# Patient Record
Sex: Female | Born: 1980 | Race: White | Hispanic: No | Marital: Married | Smoking: Former smoker
Health system: Southern US, Community
[De-identification: ages and names within clinical notes are randomized; demographics above are authoritative.]

---

## 2010-11-03 ENCOUNTER — Other Ambulatory Visit (HOSPITAL_COMMUNITY): Payer: Self-pay | Admitting: Obstetrics and Gynecology

## 2010-11-03 DIAGNOSIS — Z0489 Encounter for examination and observation for other specified reasons: Secondary | ICD-10-CM

## 2010-11-03 DIAGNOSIS — IMO0001 Reserved for inherently not codable concepts without codable children: Secondary | ICD-10-CM

## 2010-11-15 ENCOUNTER — Other Ambulatory Visit (HOSPITAL_COMMUNITY): Payer: Self-pay

## 2010-11-23 ENCOUNTER — Other Ambulatory Visit (HOSPITAL_COMMUNITY): Payer: Self-pay | Admitting: Obstetrics and Gynecology

## 2010-11-23 ENCOUNTER — Ambulatory Visit (HOSPITAL_COMMUNITY)
Admission: RE | Admit: 2010-11-23 | Discharge: 2010-11-23 | Disposition: A | Discharge status: Home / Self Care | Payer: Medicaid Other | Source: Ambulatory Visit | Attending: Obstetrics and Gynecology | Admitting: Obstetrics and Gynecology

## 2010-11-23 DIAGNOSIS — Z1389 Encounter for screening for other disorder: Secondary | ICD-10-CM | POA: Insufficient documentation

## 2010-11-23 DIAGNOSIS — Z0489 Encounter for examination and observation for other specified reasons: Secondary | ICD-10-CM

## 2010-11-23 DIAGNOSIS — IMO0001 Reserved for inherently not codable concepts without codable children: Secondary | ICD-10-CM

## 2010-11-23 DIAGNOSIS — O358XX0 Maternal care for other (suspected) fetal abnormality and damage, not applicable or unspecified: Secondary | ICD-10-CM | POA: Insufficient documentation

## 2010-11-23 DIAGNOSIS — O30009 Twin pregnancy, unspecified number of placenta and unspecified number of amniotic sacs, unspecified trimester: Secondary | ICD-10-CM | POA: Insufficient documentation

## 2010-11-23 DIAGNOSIS — Z363 Encounter for antenatal screening for malformations: Secondary | ICD-10-CM | POA: Insufficient documentation

## 2010-12-21 ENCOUNTER — Ambulatory Visit (HOSPITAL_COMMUNITY)
Admission: RE | Admit: 2010-12-21 | Discharge: 2010-12-21 | Disposition: A | Discharge status: Home / Self Care | Payer: Medicaid Other | Source: Ambulatory Visit | Attending: Obstetrics and Gynecology | Admitting: Obstetrics and Gynecology

## 2010-12-21 ENCOUNTER — Other Ambulatory Visit (HOSPITAL_COMMUNITY): Payer: Self-pay | Admitting: Obstetrics and Gynecology

## 2010-12-21 ENCOUNTER — Ambulatory Visit (HOSPITAL_COMMUNITY): Payer: Medicaid Other

## 2010-12-21 DIAGNOSIS — IMO0001 Reserved for inherently not codable concepts without codable children: Secondary | ICD-10-CM

## 2010-12-21 DIAGNOSIS — O09299 Supervision of pregnancy with other poor reproductive or obstetric history, unspecified trimester: Secondary | ICD-10-CM | POA: Insufficient documentation

## 2010-12-21 DIAGNOSIS — O34219 Maternal care for unspecified type scar from previous cesarean delivery: Secondary | ICD-10-CM | POA: Insufficient documentation

## 2010-12-21 DIAGNOSIS — O30009 Twin pregnancy, unspecified number of placenta and unspecified number of amniotic sacs, unspecified trimester: Secondary | ICD-10-CM | POA: Insufficient documentation

## 2011-01-19 ENCOUNTER — Ambulatory Visit (HOSPITAL_COMMUNITY): Payer: Medicaid Other

## 2011-01-19 ENCOUNTER — Other Ambulatory Visit (HOSPITAL_COMMUNITY): Payer: Self-pay | Admitting: Obstetrics and Gynecology

## 2011-01-19 ENCOUNTER — Ambulatory Visit (HOSPITAL_COMMUNITY)
Admission: RE | Admit: 2011-01-19 | Discharge: 2011-01-19 | Disposition: A | Discharge status: Home / Self Care | Payer: Medicaid Other | Source: Ambulatory Visit | Attending: Obstetrics and Gynecology | Admitting: Obstetrics and Gynecology

## 2011-01-19 DIAGNOSIS — O30009 Twin pregnancy, unspecified number of placenta and unspecified number of amniotic sacs, unspecified trimester: Secondary | ICD-10-CM | POA: Insufficient documentation

## 2011-01-19 DIAGNOSIS — IMO0001 Reserved for inherently not codable concepts without codable children: Secondary | ICD-10-CM

## 2011-01-19 DIAGNOSIS — Z3689 Encounter for other specified antenatal screening: Secondary | ICD-10-CM | POA: Insufficient documentation

## 2011-02-09 ENCOUNTER — Encounter (HOSPITAL_COMMUNITY): Payer: Self-pay

## 2011-02-09 ENCOUNTER — Other Ambulatory Visit (HOSPITAL_COMMUNITY): Payer: Self-pay | Admitting: Obstetrics and Gynecology

## 2011-02-09 ENCOUNTER — Ambulatory Visit (HOSPITAL_COMMUNITY): Payer: Medicaid Other

## 2011-02-09 ENCOUNTER — Ambulatory Visit (HOSPITAL_COMMUNITY)
Admission: RE | Admit: 2011-02-09 | Discharge: 2011-02-09 | Disposition: A | Discharge status: Home / Self Care | Payer: Medicaid Other | Source: Ambulatory Visit | Attending: Obstetrics and Gynecology | Admitting: Obstetrics and Gynecology

## 2011-02-09 DIAGNOSIS — O30009 Twin pregnancy, unspecified number of placenta and unspecified number of amniotic sacs, unspecified trimester: Secondary | ICD-10-CM | POA: Insufficient documentation

## 2011-02-09 DIAGNOSIS — IMO0001 Reserved for inherently not codable concepts without codable children: Secondary | ICD-10-CM

## 2011-02-09 DIAGNOSIS — O34219 Maternal care for unspecified type scar from previous cesarean delivery: Secondary | ICD-10-CM | POA: Insufficient documentation

## 2011-02-09 DIAGNOSIS — Z3689 Encounter for other specified antenatal screening: Secondary | ICD-10-CM | POA: Insufficient documentation

## 2011-02-15 ENCOUNTER — Other Ambulatory Visit (HOSPITAL_COMMUNITY): Payer: Self-pay | Admitting: Obstetrics and Gynecology

## 2011-02-15 ENCOUNTER — Ambulatory Visit (HOSPITAL_COMMUNITY)
Admission: RE | Admit: 2011-02-15 | Discharge: 2011-02-15 | Disposition: A | Discharge status: Home / Self Care | Payer: Medicaid Other | Source: Ambulatory Visit | Attending: Obstetrics and Gynecology | Admitting: Obstetrics and Gynecology

## 2011-02-15 DIAGNOSIS — IMO0001 Reserved for inherently not codable concepts without codable children: Secondary | ICD-10-CM

## 2011-02-15 DIAGNOSIS — Z3689 Encounter for other specified antenatal screening: Secondary | ICD-10-CM | POA: Insufficient documentation

## 2011-02-15 DIAGNOSIS — O30009 Twin pregnancy, unspecified number of placenta and unspecified number of amniotic sacs, unspecified trimester: Secondary | ICD-10-CM | POA: Insufficient documentation

## 2011-02-15 DIAGNOSIS — O34219 Maternal care for unspecified type scar from previous cesarean delivery: Secondary | ICD-10-CM | POA: Insufficient documentation

## 2011-02-21 ENCOUNTER — Other Ambulatory Visit (HOSPITAL_COMMUNITY): Payer: Self-pay | Admitting: Obstetrics and Gynecology

## 2011-02-21 DIAGNOSIS — IMO0001 Reserved for inherently not codable concepts without codable children: Secondary | ICD-10-CM

## 2011-02-22 ENCOUNTER — Ambulatory Visit (HOSPITAL_COMMUNITY)
Admission: RE | Admit: 2011-02-22 | Discharge: 2011-02-22 | Disposition: A | Discharge status: Home / Self Care | Payer: Medicaid Other | Source: Ambulatory Visit | Attending: Obstetrics and Gynecology | Admitting: Obstetrics and Gynecology

## 2011-02-22 ENCOUNTER — Other Ambulatory Visit (HOSPITAL_COMMUNITY): Payer: Self-pay | Admitting: Obstetrics and Gynecology

## 2011-02-22 DIAGNOSIS — O30009 Twin pregnancy, unspecified number of placenta and unspecified number of amniotic sacs, unspecified trimester: Secondary | ICD-10-CM | POA: Insufficient documentation

## 2011-02-22 DIAGNOSIS — IMO0001 Reserved for inherently not codable concepts without codable children: Secondary | ICD-10-CM

## 2011-02-22 DIAGNOSIS — Z3689 Encounter for other specified antenatal screening: Secondary | ICD-10-CM | POA: Insufficient documentation

## 2011-02-28 ENCOUNTER — Ambulatory Visit (HOSPITAL_COMMUNITY)
Admission: RE | Admit: 2011-02-28 | Discharge: 2011-02-28 | Disposition: A | Discharge status: Home / Self Care | Payer: Medicaid Other | Source: Ambulatory Visit | Attending: Obstetrics and Gynecology | Admitting: Obstetrics and Gynecology

## 2011-02-28 ENCOUNTER — Other Ambulatory Visit (HOSPITAL_COMMUNITY): Payer: Self-pay | Admitting: Obstetrics and Gynecology

## 2011-02-28 DIAGNOSIS — IMO0001 Reserved for inherently not codable concepts without codable children: Secondary | ICD-10-CM

## 2011-02-28 DIAGNOSIS — Z3689 Encounter for other specified antenatal screening: Secondary | ICD-10-CM | POA: Insufficient documentation

## 2011-02-28 DIAGNOSIS — O30009 Twin pregnancy, unspecified number of placenta and unspecified number of amniotic sacs, unspecified trimester: Secondary | ICD-10-CM | POA: Insufficient documentation

## 2011-03-07 ENCOUNTER — Ambulatory Visit (HOSPITAL_COMMUNITY)
Admission: RE | Admit: 2011-03-07 | Discharge: 2011-03-07 | Disposition: A | Discharge status: Home / Self Care | Payer: Medicaid Other | Source: Ambulatory Visit | Attending: Obstetrics and Gynecology | Admitting: Obstetrics and Gynecology

## 2011-03-07 DIAGNOSIS — O34219 Maternal care for unspecified type scar from previous cesarean delivery: Secondary | ICD-10-CM | POA: Insufficient documentation

## 2011-03-07 DIAGNOSIS — Z3689 Encounter for other specified antenatal screening: Secondary | ICD-10-CM | POA: Insufficient documentation

## 2011-03-07 DIAGNOSIS — O30009 Twin pregnancy, unspecified number of placenta and unspecified number of amniotic sacs, unspecified trimester: Secondary | ICD-10-CM | POA: Insufficient documentation

## 2011-03-07 DIAGNOSIS — IMO0001 Reserved for inherently not codable concepts without codable children: Secondary | ICD-10-CM

## 2011-03-14 ENCOUNTER — Encounter (HOSPITAL_COMMUNITY): Payer: Self-pay

## 2011-03-14 ENCOUNTER — Ambulatory Visit (HOSPITAL_COMMUNITY)
Admission: RE | Admit: 2011-03-14 | Discharge: 2011-03-14 | Disposition: A | Discharge status: Home / Self Care | Payer: Medicaid Other | Source: Ambulatory Visit | Attending: Obstetrics and Gynecology | Admitting: Obstetrics and Gynecology

## 2011-03-14 DIAGNOSIS — O34219 Maternal care for unspecified type scar from previous cesarean delivery: Secondary | ICD-10-CM | POA: Insufficient documentation

## 2011-03-14 DIAGNOSIS — Z3689 Encounter for other specified antenatal screening: Secondary | ICD-10-CM | POA: Insufficient documentation

## 2011-03-14 DIAGNOSIS — O30009 Twin pregnancy, unspecified number of placenta and unspecified number of amniotic sacs, unspecified trimester: Secondary | ICD-10-CM | POA: Insufficient documentation

## 2011-03-14 DIAGNOSIS — IMO0001 Reserved for inherently not codable concepts without codable children: Secondary | ICD-10-CM

## 2011-03-21 ENCOUNTER — Other Ambulatory Visit (HOSPITAL_COMMUNITY): Payer: Self-pay | Admitting: Obstetrics and Gynecology

## 2011-03-21 ENCOUNTER — Ambulatory Visit (HOSPITAL_COMMUNITY)
Admission: RE | Admit: 2011-03-21 | Discharge: 2011-03-21 | Disposition: A | Discharge status: Home / Self Care | Payer: Medicaid Other | Source: Ambulatory Visit | Attending: Obstetrics and Gynecology | Admitting: Obstetrics and Gynecology

## 2011-03-21 DIAGNOSIS — O30009 Twin pregnancy, unspecified number of placenta and unspecified number of amniotic sacs, unspecified trimester: Secondary | ICD-10-CM | POA: Insufficient documentation

## 2011-03-21 DIAGNOSIS — Z3689 Encounter for other specified antenatal screening: Secondary | ICD-10-CM | POA: Insufficient documentation

## 2011-03-21 DIAGNOSIS — O34219 Maternal care for unspecified type scar from previous cesarean delivery: Secondary | ICD-10-CM | POA: Insufficient documentation

## 2011-03-21 DIAGNOSIS — IMO0001 Reserved for inherently not codable concepts without codable children: Secondary | ICD-10-CM

## 2011-03-24 ENCOUNTER — Ambulatory Visit (HOSPITAL_COMMUNITY)
Admission: RE | Admit: 2011-03-24 | Discharge: 2011-03-24 | Disposition: A | Discharge status: Home / Self Care | Payer: Medicaid Other | Source: Ambulatory Visit | Attending: Obstetrics and Gynecology | Admitting: Obstetrics and Gynecology

## 2011-03-24 VITALS — BP 122/72 | HR 98 | Wt 250.0 lb

## 2011-03-24 DIAGNOSIS — IMO0001 Reserved for inherently not codable concepts without codable children: Secondary | ICD-10-CM

## 2011-03-24 DIAGNOSIS — O30009 Twin pregnancy, unspecified number of placenta and unspecified number of amniotic sacs, unspecified trimester: Secondary | ICD-10-CM | POA: Insufficient documentation

## 2011-03-24 DIAGNOSIS — Z3689 Encounter for other specified antenatal screening: Secondary | ICD-10-CM | POA: Insufficient documentation

## 2011-03-28 ENCOUNTER — Other Ambulatory Visit (HOSPITAL_COMMUNITY): Payer: Self-pay | Admitting: Obstetrics and Gynecology

## 2011-03-28 ENCOUNTER — Ambulatory Visit (HOSPITAL_COMMUNITY)
Admission: RE | Admit: 2011-03-28 | Discharge: 2011-03-28 | Disposition: A | Discharge status: Home / Self Care | Payer: Medicaid Other | Source: Ambulatory Visit | Attending: Obstetrics and Gynecology | Admitting: Obstetrics and Gynecology

## 2011-03-28 DIAGNOSIS — IMO0001 Reserved for inherently not codable concepts without codable children: Secondary | ICD-10-CM

## 2011-03-28 DIAGNOSIS — O30009 Twin pregnancy, unspecified number of placenta and unspecified number of amniotic sacs, unspecified trimester: Secondary | ICD-10-CM | POA: Insufficient documentation

## 2011-06-13 ENCOUNTER — Encounter (HOSPITAL_COMMUNITY): Payer: Self-pay

## 2014-06-29 ENCOUNTER — Encounter (HOSPITAL_COMMUNITY): Payer: Self-pay

## 2021-08-08 ENCOUNTER — Emergency Department (HOSPITAL_COMMUNITY): Payer: Worker's Compensation

## 2021-08-08 ENCOUNTER — Emergency Department (HOSPITAL_COMMUNITY)
Admission: EM | Admit: 2021-08-08 | Discharge: 2021-08-08 | Disposition: A | Discharge status: Home / Self Care | Payer: Worker's Compensation | Attending: Emergency Medicine | Admitting: Emergency Medicine

## 2021-08-08 ENCOUNTER — Encounter (HOSPITAL_COMMUNITY): Payer: Self-pay | Admitting: Emergency Medicine

## 2021-08-08 ENCOUNTER — Other Ambulatory Visit: Payer: Self-pay

## 2021-08-08 DIAGNOSIS — M79604 Pain in right leg: Secondary | ICD-10-CM | POA: Diagnosis not present

## 2021-08-08 DIAGNOSIS — W19XXXA Unspecified fall, initial encounter: Secondary | ICD-10-CM

## 2021-08-08 DIAGNOSIS — M546 Pain in thoracic spine: Secondary | ICD-10-CM | POA: Insufficient documentation

## 2021-08-08 DIAGNOSIS — Y990 Civilian activity done for income or pay: Secondary | ICD-10-CM | POA: Insufficient documentation

## 2021-08-08 DIAGNOSIS — W1809XA Striking against other object with subsequent fall, initial encounter: Secondary | ICD-10-CM | POA: Diagnosis not present

## 2021-08-08 DIAGNOSIS — T148XXA Other injury of unspecified body region, initial encounter: Secondary | ICD-10-CM

## 2021-08-08 DIAGNOSIS — M79605 Pain in left leg: Secondary | ICD-10-CM | POA: Diagnosis not present

## 2021-08-08 DIAGNOSIS — R252 Cramp and spasm: Secondary | ICD-10-CM | POA: Insufficient documentation

## 2021-08-08 DIAGNOSIS — Y9269 Other specified industrial and construction area as the place of occurrence of the external cause: Secondary | ICD-10-CM | POA: Diagnosis not present

## 2021-08-08 DIAGNOSIS — T1490XA Injury, unspecified, initial encounter: Secondary | ICD-10-CM

## 2021-08-08 DIAGNOSIS — S40011A Contusion of right shoulder, initial encounter: Secondary | ICD-10-CM | POA: Diagnosis not present

## 2021-08-08 DIAGNOSIS — M545 Low back pain, unspecified: Secondary | ICD-10-CM | POA: Insufficient documentation

## 2021-08-08 DIAGNOSIS — S4991XA Unspecified injury of right shoulder and upper arm, initial encounter: Secondary | ICD-10-CM | POA: Diagnosis present

## 2021-08-08 LAB — BASIC METABOLIC PANEL
Anion gap: 11 (ref 5–15)
BUN: 17 mg/dL (ref 6–20)
CO2: 24 mmol/L (ref 22–32)
Calcium: 9.2 mg/dL (ref 8.9–10.3)
Chloride: 101 mmol/L (ref 98–111)
Creatinine, Ser: 1.01 mg/dL — ABNORMAL HIGH (ref 0.44–1.00)
GFR, Estimated: >60 mL/min (ref >60)
Glucose, Bld: 90 mg/dL (ref 70–99)
Potassium: 3.8 mmol/L (ref 3.5–5.1)
Sodium: 136 mmol/L (ref 135–145)

## 2021-08-08 LAB — I-STAT CHEM 8, ED
BUN: 18 mg/dL (ref 6–20)
Calcium, Ion: 1.18 mmol/L (ref 1.15–1.40)
Chloride: 102 mmol/L (ref 98–111)
Creatinine, Ser: 0.9 mg/dL (ref 0.44–1.00)
Glucose, Bld: 87 mg/dL (ref 70–99)
HCT: 40 % (ref 36.0–46.0)
Hemoglobin: 13.6 g/dL (ref 12.0–15.0)
Potassium: 3.8 mmol/L (ref 3.5–5.1)
Sodium: 137 mmol/L (ref 135–145)
TCO2: 23 mmol/L (ref 22–32)

## 2021-08-08 LAB — CBC WITH DIFFERENTIAL/PLATELET
Abs Immature Granulocytes: 0.08 10*3/uL — ABNORMAL HIGH (ref 0.00–0.07)
Basophils Absolute: 0 10*3/uL (ref 0.0–0.1)
Basophils Relative: 0 %
Eosinophils Absolute: 0 10*3/uL (ref 0.0–0.5)
Eosinophils Relative: 0 %
HCT: 39.2 % (ref 36.0–46.0)
Hemoglobin: 13.1 g/dL (ref 12.0–15.0)
Immature Granulocytes: 1 %
Lymphocytes Relative: 15 %
Lymphs Abs: 2 10*3/uL (ref 0.7–4.0)
MCH: 29 pg (ref 26.0–34.0)
MCHC: 33.4 g/dL (ref 30.0–36.0)
MCV: 86.7 fL (ref 80.0–100.0)
Monocytes Absolute: 0.8 10*3/uL (ref 0.1–1.0)
Monocytes Relative: 6 %
Neutro Abs: 10 10*3/uL — ABNORMAL HIGH (ref 1.7–7.7)
Neutrophils Relative %: 78 %
Platelets: 225 10*3/uL (ref 150–400)
RBC: 4.52 MIL/uL (ref 3.87–5.11)
RDW: 13.2 % (ref 11.5–15.5)
WBC: 12.9 10*3/uL — ABNORMAL HIGH (ref 4.0–10.5)
nRBC: 0 % (ref 0.0–0.2)

## 2021-08-08 LAB — I-STAT BETA HCG BLOOD, ED (MC, WL, AP ONLY): I-stat hCG, quantitative: <5 m[IU]/mL (ref <5)

## 2021-08-08 LAB — CK: Total CK: 191 U/L (ref 38–234)

## 2021-08-08 MED ORDER — ONDANSETRON HCL 4 MG/2ML IJ SOLN
4.0000 mg | Freq: Once | INTRAMUSCULAR | Status: AC
  Administered 2021-08-08: 4 mg via INTRAVENOUS
  Filled 2021-08-08: qty 2

## 2021-08-08 MED ORDER — MORPHINE SULFATE (PF) 4 MG/ML IV SOLN: 4.0000 mg | Freq: Once | INTRAVENOUS | Status: DC

## 2021-08-08 MED ORDER — KETOROLAC TROMETHAMINE 30 MG/ML IJ SOLN
30.0000 mg | Freq: Once | INTRAMUSCULAR | Status: AC
  Administered 2021-08-08: 30 mg via INTRAVENOUS
  Filled 2021-08-08: qty 1

## 2021-08-08 MED ORDER — MORPHINE SULFATE (PF) 4 MG/ML IV SOLN
4.0000 mg | Freq: Once | INTRAVENOUS | Status: AC
  Administered 2021-08-08: 4 mg via INTRAVENOUS
  Filled 2021-08-08: qty 1

## 2021-08-08 MED ORDER — LACTATED RINGERS IV BOLUS
1000.0000 mL | Freq: Once | INTRAVENOUS | Status: AC
  Administered 2021-08-08: 1000 mL via INTRAVENOUS

## 2021-08-08 MED ORDER — IOHEXOL 300 MG/ML  SOLN
100.0000 mL | Freq: Once | INTRAMUSCULAR | Status: AC | PRN
  Administered 2021-08-08: 100 mL via INTRAVENOUS

## 2021-08-08 NOTE — ED Triage Notes (Signed)
Pt brought to ED by [facility] via stretcher with C Collar in place after incident at workplace in which pt was knocked backward by heavy stage-light type item that weight approximately 600lb. Pt states she fell flat on back and was a ground-level fall. Pt reports pain to mid-back and rt posterior shoulder . Pt denies LOC or head trauma. Reports wearing hardhat. AOX4 at time of arrival to ED

## 2021-08-08 NOTE — ED Provider Notes (Signed)
[facility] EMERGENCY DEPARTMENT Provider Note   CSN: [id number] Arrival date & time: 08/08/21  0059     History Chief Complaint  Patient presents with   Stefanie Schneider is a 40 y.o. female.  Patient arrives via EMS after fall while working at [employer].  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  This rigging came loose and knocked her down to the ground landing on her low back.  She is not certain what part hit her but may have been directly contacted in the right shoulder and upper chest area.  She is complaining of low back pain "from bra strap down".  She also has pain to her right posterior shoulder.  Denies hitting her head or losing consciousness.  Remembers the whole incident.  Had a hard hat on. Denies daily equipment falling on top of her.  She denies any head, neck, chest or abdominal pain.  She complains of severe pain to her right posterior shoulder and trapezius area where she has an apparent hematoma. She is describing bilateral low back pain that radiates down her legs.  She describes a "charley horse" in bilateral legs that is worse with movement and worse on the right.  No weakness in her legs.  Denies pins-and-needles sensation.  Denies any loss of bowel or bladder control.  No history of previous back or neck problems. No difficulty breathing.  No abdominal pain.  The history is provided by the patient and the EMS personnel.  Fall Pertinent negatives include no chest pain, no abdominal pain, no headaches and no shortness of breath.      No past medical history on file.  There are no problems to display for this patient.   * The histories are not reviewed yet. Please review them in the "History" navigator section and refresh this [place].   OB History     Gravida  4   Para  2   Term  1   Preterm  1   AB  1   Living  2      SAB  1   IAB  0   Ectopic  0   Multiple  0    Live Births  2           No family history on file.     Home Medications Prior to Admission medications   Medication Sig Start Date End Date Taking? Authorizing Provider  Prenatal Vitamins (DIS) TABS Take by mouth.      [provider]    Allergies    Patient has no known allergies.  Review of Systems   Review of Systems  Constitutional:  Negative for activity change and appetite change.  HENT:  Negative for congestion and rhinorrhea.   Respiratory:  Negative for cough, chest tightness and shortness of breath.   Cardiovascular:  Negative for chest pain.  Gastrointestinal:  Negative for abdominal pain and nausea.  Genitourinary:  Negative for dysuria and hematuria.  Musculoskeletal:  Positive for arthralgias, back pain and myalgias. Negative for neck pain.  Skin:  Negative for rash.  Neurological:  Negative for dizziness, weakness, numbness and headaches.   all other systems are negative except as noted in the HPI and PMH.   Physical Exam Updated Vital Signs BP 136/78   Resp 18   Ht 5\' 4"  (1.626 m)   Wt 109.8 kg   BMI 41.54 kg/m  Physical Exam Vitals and nursing note reviewed.  Constitutional:      General: She is not in acute distress.    Appearance: She is well-developed. She is not ill-appearing.  HENT:     Head: Normocephalic and atraumatic.     Mouth/Throat:     Pharynx: No oropharyngeal exudate.  Eyes:     Conjunctiva/sclera: Conjunctivae normal.     Pupils: Pupils are equal, round, and reactive to light.  Neck:     Comments: C-collar in place, no midline tenderness Cardiovascular:     Rate and Rhythm: Normal rate and regular rhythm.     Heart sounds: Normal heart sounds. No murmur heard. Pulmonary:     Effort: Pulmonary effort is normal. No respiratory distress.     Breath sounds: Normal breath sounds.  Chest:     Chest wall: No tenderness.  Abdominal:     Palpations: Abdomen is soft.     Tenderness: There is no abdominal  tenderness. There is no guarding or rebound.  Musculoskeletal:        General: Swelling, tenderness and signs of injury present. Normal range of motion.     Cervical back: Normal range of motion and neck supple.     Comments: Midline thoracic tenderness without step-off.  No lumbar tenderness in the midline.  Paraspinal lumbar tenderness.  Tender hematoma/nodule to right trapezius Right arm held against body with forearm flexed.  Skin:    General: Skin is warm.  Neurological:     Mental Status: She is alert and oriented to person, place, and time.     Cranial Nerves: No cranial nerve deficit.     Motor: No abnormal muscle tone.     Coordination: Coordination normal.     Comments: Cranial nerves II to XII intact.  Equal grip strength bilaterally. Able to lift legs off the bed bilaterally.  Able to wiggle toes. Pain with straight leg raise on the right.  5/5 strength in bilateral lower extremities. Ankle plantar and dorsiflexion intact. Great toe extension intact bilaterally. +2 DP and PT pulses. +2 patellar reflexes bilaterally.   Psychiatric:        Behavior: Behavior normal.    ED Results / Procedures / Treatments   Labs (all labs ordered are listed, but only abnormal results are displayed) Labs Reviewed  CBC WITH DIFFERENTIAL/PLATELET - Abnormal; Notable for the following components:      Result Value   WBC 12.9 (*)    Neutro Abs 10.0 (*)    Abs Immature Granulocytes 0.08 (*)    All other components within normal limits  BASIC METABOLIC PANEL - Abnormal; Notable for the following components:   Creatinine, Ser 1.01 (*)    All other components within normal limits  RESP PANEL BY RT-PCR (FLU A&B, COVID) [id number]  CK  I-STAT BETA HCG BLOOD, ED ([facility], [facility], [facility] ONLY)  I-STAT CHEM 8, ED    EKG None  Radiology DG Shoulder Right  Result Date: 08/08/2021 CLINICAL DATA:  Fall and trauma to the right shoulder. EXAM: RIGHT SHOULDER - 2+ VIEW COMPARISON:  None. FINDINGS: There is no  evidence of fracture or dislocation. There is no evidence of arthropathy or other focal bone abnormality. Soft tissues are unremarkable. IMPRESSION: Negative. Electronically Signed   By: Elgie Collard M.D.   On: 08/08/2021 02:50   CT Head Wo Contrast  Result Date: 08/08/2021 CLINICAL DATA:  Neck trauma, midline tenderness. Head trauma, moderate to severe. EXAM: CT HEAD WITHOUT CONTRAST CT CERVICAL SPINE  WITHOUT CONTRAST TECHNIQUE: Multidetector CT imaging of the head and cervical spine was performed following the standard protocol without intravenous contrast. Multiplanar CT image reconstructions of the cervical spine were also generated. COMPARISON:  01/20/2017. FINDINGS: CT HEAD FINDINGS Brain: No acute intracranial hemorrhage, midline shift or mass effect. No extra-axial fluid collection. Gray-white matter differentiation is within normal limits and there is no hydrocephalus. Vascular: No hyperdense vessel or unexpected calcification. Skull: Normal. Negative for fracture or focal lesion. Sinuses/Orbits: No acute finding. Other: None. CT CERVICAL SPINE FINDINGS Alignment: Normal. Skull base and vertebrae: No acute fracture. No primary bone lesion or focal pathologic process. Soft tissues and spinal canal: No prevertebral fluid or swelling. No visible canal hematoma. Disc levels: Intervertebral disc space is preserved. Mild endplate osteophyte formation is noted along the anterior aspect of C5. No significant spinal canal or neural foraminal stenosis. Upper chest: Negative. Other: There is a hyperdense collection in the subcutaneous fat over the right shoulder measuring 3.9 x 1.7 cm IMPRESSION: 1. No acute intracranial process. 2. No acute fracture or subluxation in the cervical spine. 3. Hyperdense structure with surrounding fat stranding in the subcutaneous fat over the right shoulder, possible hematoma given history of trauma. Electronically Signed   By: Brett Fairy M.D.   On: 08/08/2021 02:53   CT  Cervical Spine Wo Contrast  Result Date: 08/08/2021 CLINICAL DATA:  Neck trauma, midline tenderness. Head trauma, moderate to severe. EXAM: CT HEAD WITHOUT CONTRAST CT CERVICAL SPINE WITHOUT CONTRAST TECHNIQUE: Multidetector CT imaging of the head and cervical spine was performed following the standard protocol without intravenous contrast. Multiplanar CT image reconstructions of the cervical spine were also generated. COMPARISON:  01/20/2017. FINDINGS: CT HEAD FINDINGS Brain: No acute intracranial hemorrhage, midline shift or mass effect. No extra-axial fluid collection. Gray-white matter differentiation is within normal limits and there is no hydrocephalus. Vascular: No hyperdense vessel or unexpected calcification. Skull: Normal. Negative for fracture or focal lesion. Sinuses/Orbits: No acute finding. Other: None. CT CERVICAL SPINE FINDINGS Alignment: Normal. Skull base and vertebrae: No acute fracture. No primary bone lesion or focal pathologic process. Soft tissues and spinal canal: No prevertebral fluid or swelling. No visible canal hematoma. Disc levels: Intervertebral disc space is preserved. Mild endplate osteophyte formation is noted along the anterior aspect of C5. No significant spinal canal or neural foraminal stenosis. Upper chest: Negative. Other: There is a hyperdense collection in the subcutaneous fat over the right shoulder measuring 3.9 x 1.7 cm IMPRESSION: 1. No acute intracranial process. 2. No acute fracture or subluxation in the cervical spine. 3. Hyperdense structure with surrounding fat stranding in the subcutaneous fat over the right shoulder, possible hematoma given history of trauma. Electronically Signed   By: Brett Fairy M.D.   On: 08/08/2021 02:53   CT CHEST ABDOMEN PELVIS W CONTRAST  Result Date: 08/08/2021 CLINICAL DATA:  Trauma, knocked backward by heavy stage light. EXAM: CT CHEST, ABDOMEN, AND PELVIS WITH CONTRAST TECHNIQUE: Multidetector CT imaging of the chest, abdomen  and pelvis was performed following the standard protocol during bolus administration of intravenous contrast. CONTRAST:  119mL OMNIPAQUE IOHEXOL 300 MG/ML  SOLN COMPARISON:  10/30/2015. FINDINGS: CT CHEST FINDINGS Cardiovascular: The heart is normal in size and there is no pericardial effusion. The aorta and pulmonary trunk are normal in caliber. Mediastinum/Nodes: No mediastinal or axillary lymphadenopathy. Evaluation of the hila is limited due to lack of IV contrast. A subcentimeter hypodensity is present in the left lobe of the thyroid gland measuring 3 mm. The  trachea and esophagus are within normal limits. Lungs/Pleura: The lungs are clear.  No effusion or pneumothorax. Musculoskeletal: Subcutaneous fat stranding is noted along the posterior aspect of the right shoulder. No acute fracture. CT ABDOMEN PELVIS FINDINGS Hepatobiliary: No hepatic injury or perihepatic hematoma. Gallbladder is surgically absent. No biliary ductal dilatation. Pancreas: Unremarkable. No pancreatic ductal dilatation or surrounding inflammatory changes. Spleen: No splenic injury or perisplenic hematoma. Adrenals/Urinary Tract: Adrenal glands are unremarkable. Kidneys are normal, without renal calculi, focal lesion, or hydronephrosis. Bladder is unremarkable. Stomach/Bowel: Stomach is within normal limits. Appendix appears normal. No evidence of bowel wall thickening, distention, or inflammatory changes. There is colonic diverticulosis without evidence of diverticulitis. Vascular/Lymphatic: No significant vascular findings are present. No enlarged abdominal or pelvic lymph nodes. Reproductive: Uterus and bilateral adnexa are unremarkable. Other: No free fluid.  Small fat containing umbilical hernia. Musculoskeletal: No acute fracture. IMPRESSION: 1. No evidence of acute fracture or solid organ injury. 2. Subcutaneous fat stranding along the posterior aspect of the right shoulder, possible contusion given history of trauma. Electronically  Signed   By: Brett Fairy M.D.   On: 08/08/2021 03:34   CT T-SPINE NO CHARGE  Result Date: 08/08/2021 CLINICAL DATA:  Initial evaluation for acute trauma, fall. EXAM: CT Thoracic and Lumbar spine without contrast TECHNIQUE: Multiplanar CT images of the thoracic and lumbar spine were reconstructed from contemporary CT of the Chest, Abdomen, and Pelvis CONTRAST:  None or No additional COMPARISON:  Concomitant CT of the chest, abdomen, and pelvis. FINDINGS: CT THORACIC SPINE FINDINGS Alignment: Physiologic with preservation of the normal thoracic kyphosis. No listhesis. Vertebrae: Vertebral body height maintained without acute or chronic fracture. Visualized ribs intact. No discrete or worrisome osseous lesions. Paraspinal and other soft tissues: Paraspinous soft tissues demonstrate no acute finding. Disc levels: No significant disc pathology or stenosis seen within the thoracic spine. CT LUMBAR SPINE FINDINGS Segmentation: Standard. Lowest well-formed disc space labeled the L5-S1 level. Alignment: Physiologic with preservation of the normal lumbar lordosis. No listhesis. Vertebrae: Vertebral body height maintained without acute or chronic fracture. Visualized sacrum and pelvis intact. No discrete or worrisome osseous lesions. Paraspinal and other soft tissues: Paraspinous soft tissues demonstrate no acute finding. Cholecystectomy noted. Probable hepatic steatosis noted as well. Disc levels: No significant disc pathology seen within the lumbar spine. No appreciable stenosis or impingement. IMPRESSION: No acute traumatic injury within the thoracic or lumbar spine. Electronically Signed   By: Jeannine Boga M.D.   On: 08/08/2021 03:07   CT L-SPINE NO CHARGE  Result Date: 08/08/2021 CLINICAL DATA:  Initial evaluation for acute trauma, fall. EXAM: CT Thoracic and Lumbar spine without contrast TECHNIQUE: Multiplanar CT images of the thoracic and lumbar spine were reconstructed from contemporary CT of the  Chest, Abdomen, and Pelvis CONTRAST:  None or No additional COMPARISON:  Concomitant CT of the chest, abdomen, and pelvis. FINDINGS: CT THORACIC SPINE FINDINGS Alignment: Physiologic with preservation of the normal thoracic kyphosis. No listhesis. Vertebrae: Vertebral body height maintained without acute or chronic fracture. Visualized ribs intact. No discrete or worrisome osseous lesions. Paraspinal and other soft tissues: Paraspinous soft tissues demonstrate no acute finding. Disc levels: No significant disc pathology or stenosis seen within the thoracic spine. CT LUMBAR SPINE FINDINGS Segmentation: Standard. Lowest well-formed disc space labeled the L5-S1 level. Alignment: Physiologic with preservation of the normal lumbar lordosis. No listhesis. Vertebrae: Vertebral body height maintained without acute or chronic fracture. Visualized sacrum and pelvis intact. No discrete or worrisome osseous lesions. Paraspinal and other  soft tissues: Paraspinous soft tissues demonstrate no acute finding. Cholecystectomy noted. Probable hepatic steatosis noted as well. Disc levels: No significant disc pathology seen within the lumbar spine. No appreciable stenosis or impingement. IMPRESSION: No acute traumatic injury within the thoracic or lumbar spine. Electronically Signed   By: Jeannine Boga M.D.   On: 08/08/2021 03:07   DG Chest Port 1 View  Result Date: 08/08/2021 CLINICAL DATA:  Fall. EXAM: PORTABLE CHEST 1 VIEW COMPARISON:  None. FINDINGS: The heart size and mediastinal contours are within normal limits. No consolidation, effusion, or pneumothorax. No acute osseous abnormality. IMPRESSION: No acute cardiopulmonary process. Electronically Signed   By: Brett Fairy M.D.   On: 08/08/2021 02:54    Procedures Procedures   Medications Ordered in ED Medications  morphine 4 MG/ML injection 4 mg (has no administration in time range)  ondansetron (ZOFRAN) injection 4 mg (has no administration in time range)     ED Course  I have reviewed the triage vital signs and the nursing notes.  Pertinent labs & imaging results that were available during my care of the patient were reviewed by me and considered in my medical decision making (see chart for details).    MDM Rules/Calculators/A&P                          Fall after being knocked over by a piece of heavy equipment.  Denies losing consciousness.  Complains of pain to her low back with intermittent spasms and pain to her lower extremities and tingling.  GCS 15, ABCs intact  Equal strength and sensation in lower extremities.  Equal distal pulses.  Reports cramping in her lower legs worse with movement.  Tenderness across T and L-spine.  Traumatic imaging is negative.  No step-offs or deformities. Imaging does show apparent hematoma right trapezius in the area of her pain.  Remainder of traumatic imaging is negative.  Patient still complaining of cramping in her legs and back pain.  She has no numbness on exam and no neurological deficits but was complaining of numbness in her legs earlier after the incident.  MRI will be obtained for further assessment of her spinal cord.  Low suspicion however for cord compression or cauda equina currently.  Will attempt ambulation and p.o. trial.  Care to be transferred at shift change.  Anticipate discharge home with supportive care if imaging is reassuring.  Recommend ice, anti-inflammatories for the hematoma to her trapezius area. Dr. Tyrone Nine to assume care at shift change.  Final Clinical Impression(s) / ED Diagnoses Final diagnoses:  Trauma    Rx / DC Orders ED Discharge Orders     None        Jerianne Anselmo, Annie Main, MD 08/08/21 [phone]

## 2021-08-08 NOTE — Progress Notes (Signed)
Orthopedic Tech Progress Note Patient Details:  Stefanie Schneider [birth year] [id number] Level 2 trauma  Patient ID: [name], female   DOB: Feb 03, 1981, 40 y.o.   MRN: [id number]  [name] 08/08/2021, 1:32 AM

## 2021-08-08 NOTE — Discharge Instructions (Addendum)
Your testing is negative for serious traumatic injury.  You may use Tylenol or ibuprofen as needed for aches and pain.  Follow-up with your doctor.  Return to the ED with worsening pain, weakness, numbness, tingling, bowel or bladder incontinence or any other concerns

## 2021-08-08 NOTE — ED Notes (Signed)
Patient transported to MRI 

## 2023-04-30 IMAGING — CT CT HEAD W/O CM
3 of 4 series · 13 of 47 positions shown, 15 images · non-contrast
Comparison: 01/20/2017.

CLINICAL DATA: Neck trauma, midline tenderness. Head trauma,
moderate to severe.

EXAM:
CT HEAD WITHOUT CONTRAST
CT CERVICAL SPINE WITHOUT CONTRAST
TECHNIQUE: Multidetector CT imaging of the head and cervical spine was
performed following the standard protocol without intravenous
contrast. Multiplanar CT image reconstructions of the cervical spine
were also generated.

[Series 3: head without · axial · non-contrast · 0.46mm/px · z∈[-116,+18]mm · 7 of 37 slices shown, 9 images]
[im 5/37  brain]
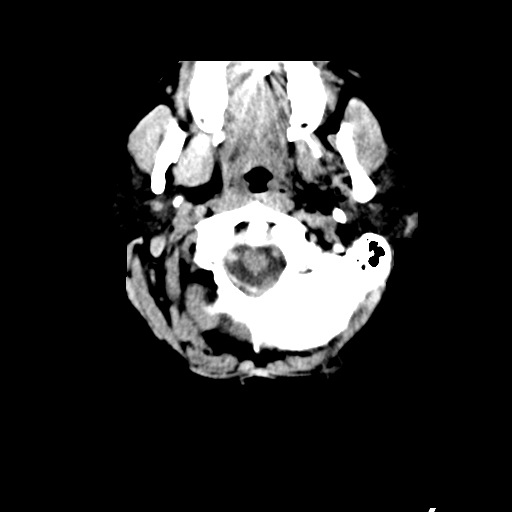
[im 5/37  bone]
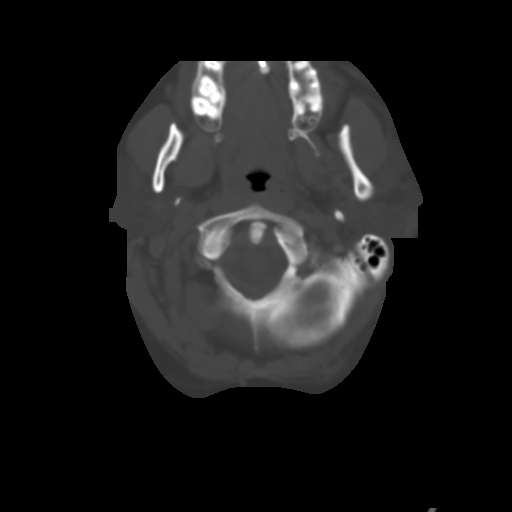
[im 10/37  brain]
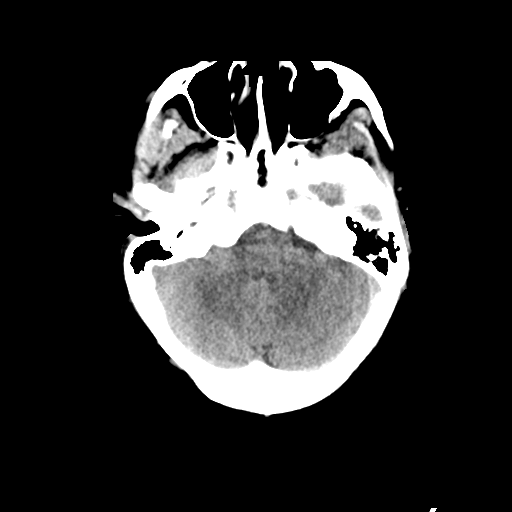
[im 14/37  brain]
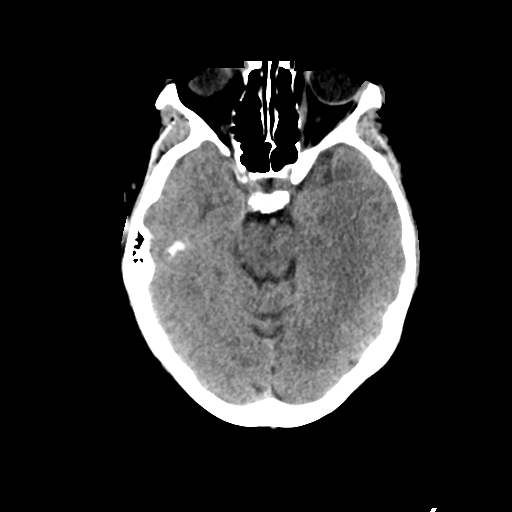
[im 19/37  brain]
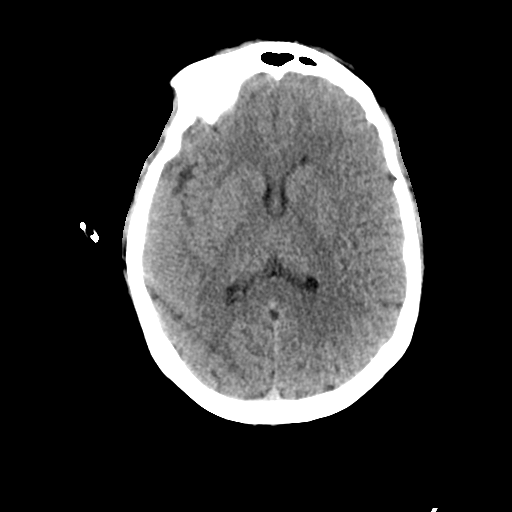
[im 23/37  brain]
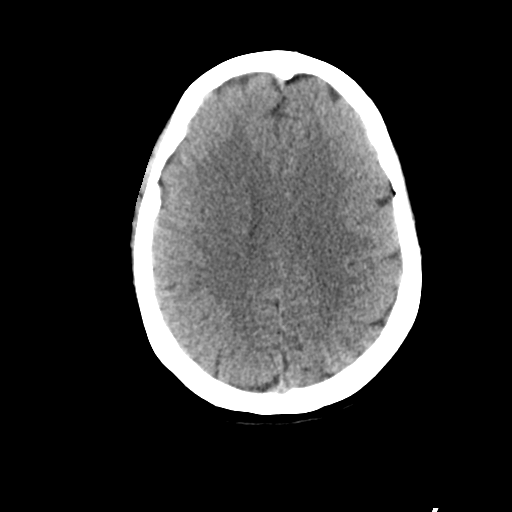
[im 23/37  bone]
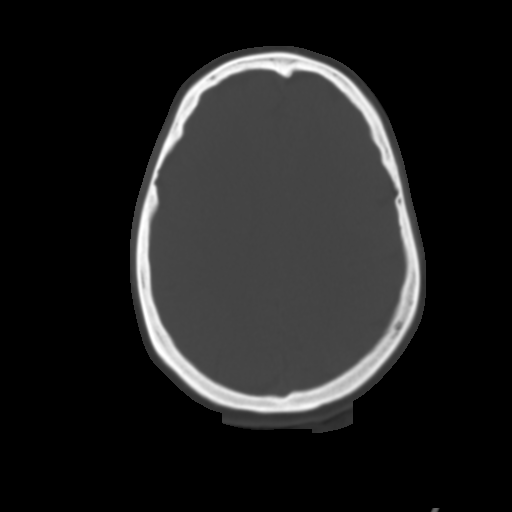
[im 28/37  brain]
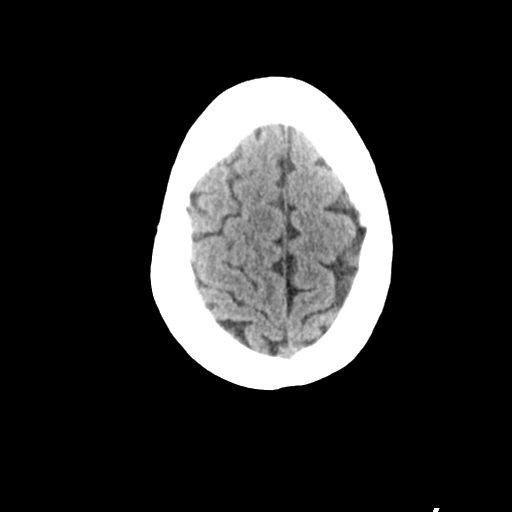
[im 32/37  brain]
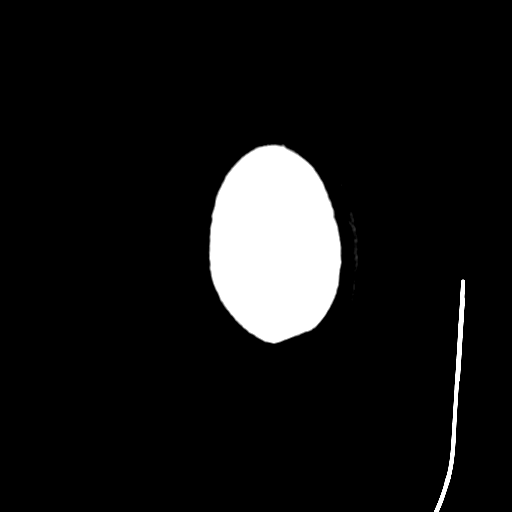

[Series 5: head without cor · coronal · non-contrast · 0.36mm/px · 3 of 67 slices shown]
[im 23/67  brain]
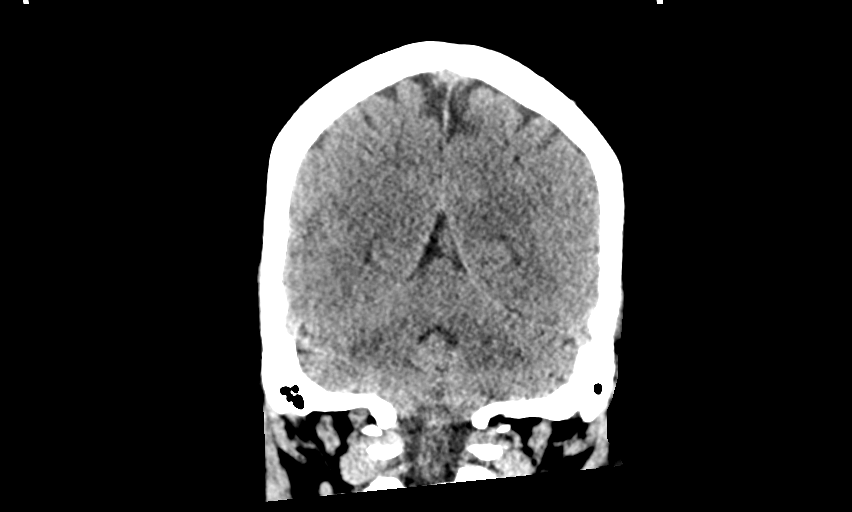
[im 30/67  brain]
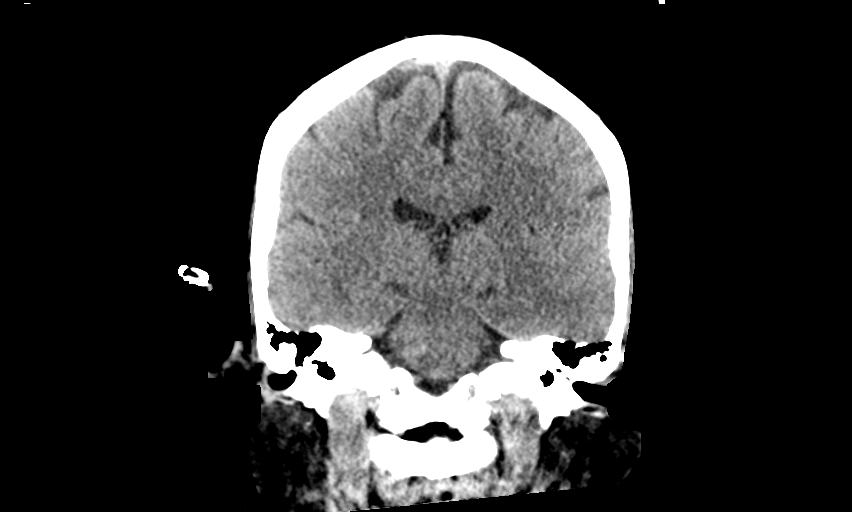
[im 37/67  brain]
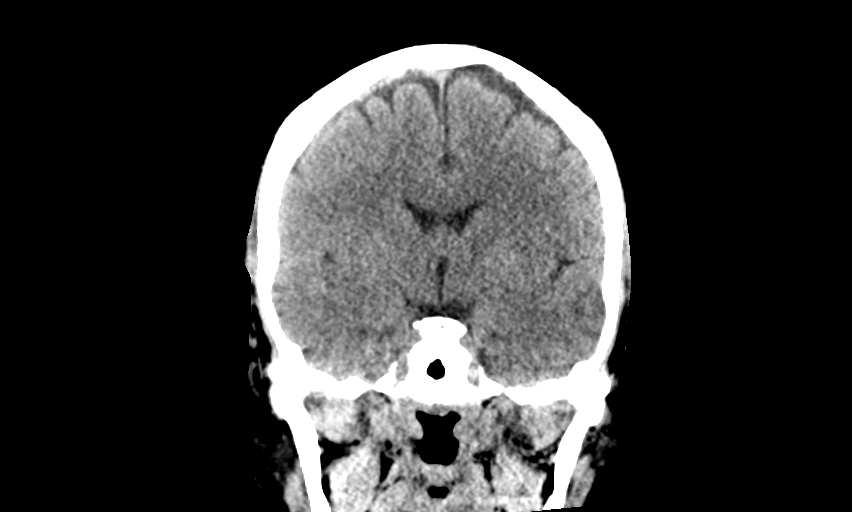

[Series 6: head without sag · sagittal · non-contrast · 0.36mm/px · 3 of 64 slices shown]
[im 22/64  brain]
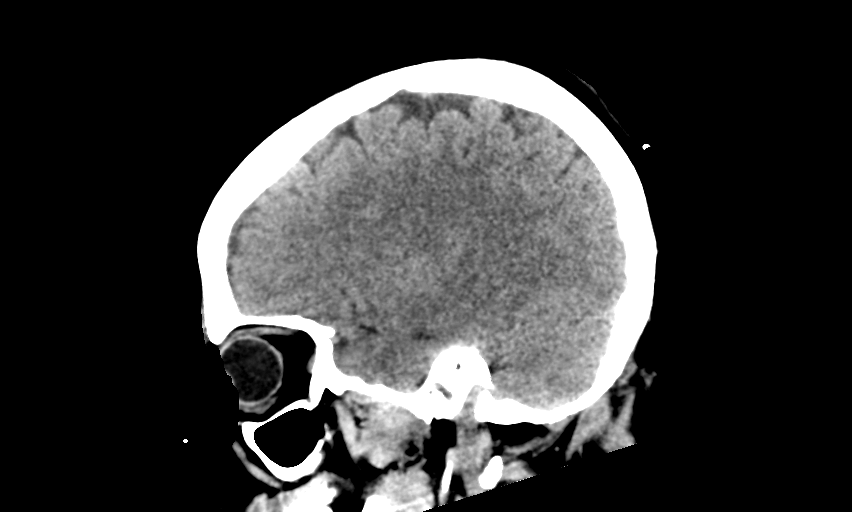
[im 32/64  brain]
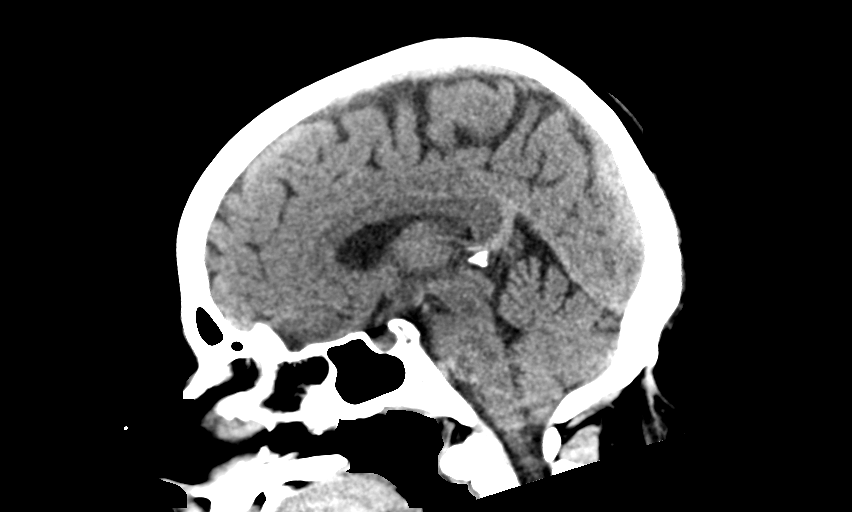
[im 43/64  brain]
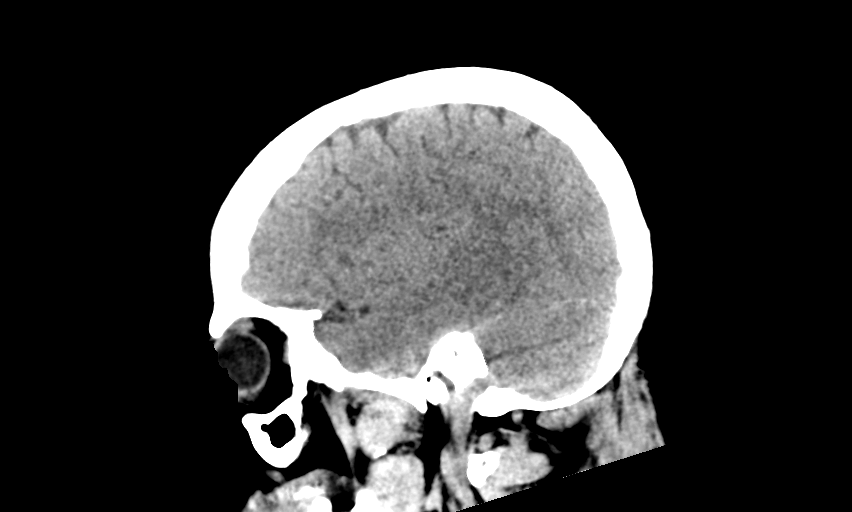

[13 of 47 positions shown; findings below may reference images not displayed]

FINDINGS: CT HEAD FINDINGS

Brain: No acute intracranial hemorrhage, midline shift or mass
effect. No extra-axial fluid collection. Gray-white matter
differentiation is within normal limits and there is no
hydrocephalus.

Vascular: No hyperdense vessel or unexpected calcification.

Skull: Normal. Negative for fracture or focal lesion.

Sinuses/Orbits: No acute finding.

Other: None.

CT CERVICAL SPINE FINDINGS

Alignment: Normal.

Skull base and vertebrae: No acute fracture. No primary bone lesion
or focal pathologic process.

Soft tissues and spinal canal: No prevertebral fluid or swelling. No
visible canal hematoma.

Disc levels: Intervertebral disc space is preserved. Mild endplate
osteophyte formation is noted along the anterior aspect of C5. No
significant spinal canal or neural foraminal stenosis.

Upper chest: Negative.

Other: There is a hyperdense collection in the subcutaneous fat over
the right shoulder measuring 3.9 x 1.7 cm
IMPRESSION: 1. No acute intracranial process.
2. No acute fracture or subluxation in the cervical spine.
3. Hyperdense structure with surrounding fat stranding in the
subcutaneous fat over the right shoulder, possible hematoma given
history of trauma.
